# Patient Record
Sex: Male | Born: 1970 | Race: White | Hispanic: No | Marital: Married | Smoking: Never smoker
Health system: Southern US, Community
[De-identification: ages and names within clinical notes are randomized; demographics above are authoritative.]

---

## 2003-06-08 ENCOUNTER — Emergency Department (HOSPITAL_COMMUNITY): Admission: EM | Admit: 2003-06-08 | Discharge: 2003-06-08 | Payer: Self-pay | Admitting: Emergency Medicine

## 2010-11-30 ENCOUNTER — Emergency Department (HOSPITAL_COMMUNITY)
Admission: EM | Admit: 2010-11-30 | Discharge: 2010-11-30 | Payer: Self-pay | Source: Home / Self Care | Admitting: Emergency Medicine

## 2010-12-07 LAB — DIFFERENTIAL
Basophils Absolute: 0 10*3/uL (ref 0.0–0.1)
Basophils Relative: 0 % (ref 0–1)
Eosinophils Absolute: 0.2 10*3/uL (ref 0.0–0.7)
Eosinophils Relative: 2 % (ref 0–5)
Lymphocytes Relative: 34 % (ref 12–46)
Lymphs Abs: 2.1 10*3/uL (ref 0.7–4.0)
Monocytes Absolute: 0.9 10*3/uL (ref 0.1–1.0)
Monocytes Relative: 14 % — ABNORMAL HIGH (ref 3–12)
Neutro Abs: 3.2 10*3/uL (ref 1.7–7.7)
Neutrophils Relative %: 50 % (ref 43–77)

## 2010-12-07 LAB — CBC
HCT: 41.5 % (ref 39.0–52.0)
Hemoglobin: 14.5 g/dL (ref 13.0–17.0)
MCH: 30.2 pg (ref 26.0–34.0)
MCHC: 34.9 g/dL (ref 30.0–36.0)
MCV: 86.5 fL (ref 78.0–100.0)
Platelets: 211 10*3/uL (ref 150–400)
RBC: 4.8 MIL/uL (ref 4.22–5.81)
RDW: 12.4 % (ref 11.5–15.5)
WBC: 6.3 10*3/uL (ref 4.0–10.5)

## 2010-12-07 LAB — URINE CULTURE
Colony Count: NO GROWTH
Culture  Setup Time: 201201090340
Culture: NO GROWTH

## 2010-12-07 LAB — URINALYSIS, ROUTINE W REFLEX MICROSCOPIC
Bilirubin Urine: NEGATIVE
Hgb urine dipstick: NEGATIVE
Ketones, ur: NEGATIVE mg/dL
Nitrite: NEGATIVE
Protein, ur: NEGATIVE mg/dL
Specific Gravity, Urine: 1.014 (ref 1.005–1.030)
Urine Glucose, Fasting: NEGATIVE mg/dL
Urobilinogen, UA: 0.2 mg/dL (ref 0.0–1.0)
pH: 7 (ref 5.0–8.0)

## 2010-12-07 LAB — COMPREHENSIVE METABOLIC PANEL
ALT: 33 U/L (ref 0–53)
AST: 24 U/L (ref 0–37)
Albumin: 3.9 g/dL (ref 3.5–5.2)
Alkaline Phosphatase: 82 U/L (ref 39–117)
BUN: 14 mg/dL (ref 6–23)
CO2: 31 mEq/L (ref 19–32)
Calcium: 9.2 mg/dL (ref 8.4–10.5)
Chloride: 106 mEq/L (ref 96–112)
Creatinine, Ser: 1.47 mg/dL (ref 0.4–1.5)
GFR calc Af Amer: >60 mL/min (ref >60)
GFR calc non Af Amer: 54 mL/min — ABNORMAL LOW (ref >60)
Glucose, Bld: 118 mg/dL — ABNORMAL HIGH (ref 70–99)
Potassium: 3.6 mEq/L (ref 3.5–5.1)
Sodium: 142 mEq/L (ref 135–145)
Total Bilirubin: 0.4 mg/dL (ref 0.3–1.2)
Total Protein: 6.9 g/dL (ref 6.0–8.3)

## 2010-12-07 LAB — LIPASE, BLOOD: Lipase: 36 U/L (ref 11–59)

## 2014-02-22 ENCOUNTER — Encounter (HOSPITAL_COMMUNITY): Payer: Self-pay | Admitting: Emergency Medicine

## 2014-02-22 ENCOUNTER — Emergency Department (HOSPITAL_COMMUNITY): Payer: BC Managed Care – PPO

## 2014-02-22 ENCOUNTER — Emergency Department (HOSPITAL_COMMUNITY)
Admission: EM | Admit: 2014-02-22 | Discharge: 2014-02-23 | Disposition: A | Discharge status: Home / Self Care | Payer: BC Managed Care – PPO | Attending: Emergency Medicine | Admitting: Emergency Medicine

## 2014-02-22 DIAGNOSIS — Y929 Unspecified place or not applicable: Secondary | ICD-10-CM | POA: Insufficient documentation

## 2014-02-22 DIAGNOSIS — Y9351 Activity, roller skating (inline) and skateboarding: Secondary | ICD-10-CM | POA: Insufficient documentation

## 2014-02-22 DIAGNOSIS — S52121A Displaced fracture of head of right radius, initial encounter for closed fracture: Secondary | ICD-10-CM

## 2014-02-22 DIAGNOSIS — IMO0002 Reserved for concepts with insufficient information to code with codable children: Secondary | ICD-10-CM | POA: Insufficient documentation

## 2014-02-22 DIAGNOSIS — S52123A Displaced fracture of head of unspecified radius, initial encounter for closed fracture: Secondary | ICD-10-CM | POA: Insufficient documentation

## 2014-02-22 MED ORDER — OXYCODONE-ACETAMINOPHEN 5-325 MG PO TABS
1.0000 | ORAL_TABLET | Freq: Once | ORAL | Status: AC
  Administered 2014-02-23: 1 via ORAL
  Filled 2014-02-22: qty 1

## 2014-02-22 NOTE — ED Notes (Signed)
[facility]

## 2014-02-22 NOTE — ED Provider Notes (Signed)
CSN: [id number]     Arrival date & time 02/22/14  2218 History  This chart was scribed for Hayden Galesavid Masneri, MD by Ardelia Memsylan Malpass, ED Scribe. This patient was seen in room TR10C/TR10C and the patient's care was started at 11:31 PM.   Chief Complaint  Patient presents with  . Arm Injury    The history is provided by the patient. No language interpreter was used.   HPI Comments: Hayden Lopez is a 43 y.o. male who presents to the Emergency Department complaining of a fall off of a skateboard that occurred about 2 hours ago. He states that he landed on concrete hitting his right elbow and shoulder, where he has been having pain since. He denies hitting his head or LOC. He denies any prior history of injury to the arm. He also states that he sustained an abrasion to his left forearm upon falling. He reports that his Tetanus vaccinations are UTD. He states that he has no medication allergies.   History reviewed. No pertinent past medical history. History reviewed. No pertinent past surgical history. No family history on file. History  Substance Use Topics  . Smoking status: Never Smoker   . Smokeless tobacco: Not on file  . Alcohol Use: Yes    Review of Systems  Musculoskeletal: Positive for arthralgias.  Skin: Positive for wound.  Neurological: Negative for syncope and headaches.  All other systems reviewed and are negative.   Allergies  Review of patient's allergies indicates no known allergies.  Home Medications  No current outpatient prescriptions on file.  Triage Vitals: BP 94/73  Pulse 86  Temp(Src) 98.3 F (36.8 C) (Oral)  Resp 18  Ht 5\' 10"  (1.778 m)  Wt 190 lb (86.183 kg)  BMI 27.26 kg/m2  SpO2 99%  Physical Exam  Nursing note and vitals reviewed. Constitutional: He is oriented to person, place, and time. He appears well-developed and well-nourished. No distress.  HENT:  Head: Normocephalic and atraumatic.  Eyes: EOM are normal.  Neck: Neck supple. No tracheal  deviation present.  Cardiovascular: Normal rate.   Pulmonary/Chest: Effort normal. No respiratory distress.  Musculoskeletal: He exhibits edema and tenderness.  Right arm: Right hand with normal grip. Wrist with normal flexion and extension. Decreased supination and pronation secondary to pain. Normal radial pulse. Elbow tenderness to the dorsal aspect of the olecranon with mild edema noted. Decreased elbow flexion and extension secondary to pain. Left forearm with abrasion noted to the laterodorsal aspect of the forearm without any significant deformity noted. Full ROM.  Neurological: He is alert and oriented to person, place, and time.  Skin: Skin is warm and dry.  Psychiatric: He has a normal mood and affect. His behavior is normal.    ED Course  Procedures (including critical care time)  DIAGNOSTIC STUDIES: Oxygen Saturation is 99% on RA, normal by my interpretation.    COORDINATION OF CARE: 11:37 PM- Discussed plan to obtain radiology of pt's right arm. Will also give pain medication. Pt advised of plan for treatment and pt agrees.  12:32 AM Xray of R elbow demonstrate a minimally comminuted fx involving the radial aspect of the radial head, with mild cortical irregularity along the articular surface.  Associated elbow joint effusion seen.  Pt is NVI.  i discussed this finding with pt and with Dr. Romeo AppleHarrison and Dr. Redgie GrayerMasneri, who recommend long arm splint, sling, and ortho f/u on Monday.  Pt prefers to be seen by Dr. Yisroel Rammingaldorf, will give referral.  Return precaution discussed.  Medications  oxyCODONE-acetaminophen (PERCOCET/ROXICET) 5-325 MG per tablet 1 tablet (not administered)   Labs Review Labs Reviewed - No data to display Imaging Review Dg Humerus Right  02/22/2014   CLINICAL DATA:  Status post fall; skateboarding injury. Pain along the distal right arm.  EXAM: RIGHT HUMERUS - 2+ VIEW  COMPARISON:  None.  FINDINGS: There is no evidence of fracture or dislocation. The right  humerus appears intact. Visualized joint spaces are preserved. The right humeral head remains seated at the glenoid fossa.  The right acromioclavicular joint is unremarkable in appearance. No significant soft tissue abnormalities are characterized on radiograph.  IMPRESSION: No evidence of fracture or dislocation.   Electronically Signed   By: Roanna Raider M.D.   On: 02/22/2014 23:43     EKG Interpretation None      MDM   Final diagnoses:  Fracture of radial head, right, closed    BP 94/73  Pulse 86  Temp(Src) 98.3 F (36.8 C) (Oral)  Resp 18  Ht 5\' 10"  (1.778 m)  Wt 190 lb (86.183 kg)  BMI 27.26 kg/m2  SpO2 99%  I have reviewed nursing notes and vital signs. I personally reviewed the imaging tests through PACS system  I reviewed available ER/hospitalization records thought the EMR  I personally performed the services described in this documentation, which was scribed in my presence. The recorded information has been reviewed and is accurate.    Fayrene Helper, PA-C 02/23/14 [phone]

## 2014-02-22 NOTE — ED Notes (Signed)
The pt was riding a skateboard tonight and he struck a curb and fell of the skateboard.  Painful rt  Upper arm pain and elbow..  Lt elbow has an abrasion and he struck his head no loc

## 2014-02-22 NOTE — ED Notes (Signed)
Pt alert, NAD, calm, interactive. EDPA  into room.

## 2014-02-23 MED ORDER — OXYCODONE-ACETAMINOPHEN 5-325 MG PO TABS: 2.0000 | ORAL_TABLET | ORAL | Status: DC | PRN

## 2014-02-23 NOTE — Progress Notes (Signed)
Orthopedic Tech Progress Note Patient Details:  Hayden HuhJoseph Lopez 10/19/1971 [id number]  Ortho Devices Type of Ortho Device: Arm sling;Post (long arm) splint   Haskell Flirtewsome, Rajah Lamba M 02/23/2014, 12:35 AM

## 2014-02-23 NOTE — ED Notes (Signed)
Pt fell skateboarding, c/o R elbow pain, radiates up (mid humerus) and down (mid ulnar/radius), CMS intact, mentions some mild numbness and tingling. Guarding R elbow movements. Abrasion noted to L FA. (denies: LOC, head, neck or back pain, denies other pains or injuries, nv, dizziness or other sx. No meds PTA. Ice applied to R elbow.

## 2014-02-23 NOTE — Discharge Instructions (Signed)
Please call and follow up with Dr. Jerl Santos on Monday for close evaluation of your elbow injury.  Follow instruction below.  Return if you have any concerns.   Elbow Fracture, Radial Head with Rehab The radial head is the end of the forearm bone on the thumb side of the arm (radius). It is part of the elbow. The radial head is vulnerable to both complete and incomplete fractures. SYMPTOMS   Severe elbow and arm pain, at the time of injury.  Tenderness, inflammation, and later bruising (contusion) of the elbow (within 48 hours).  Visible deformity, if the fracture is complete, and the bone fragments are not aligned properly (displaced).  Numbness, coldness, or paralysis in the elbow, forearm, or hand from pressure on the blood vessels or nerves (uncommon). CAUSES  An elbow fracture occurs when a force is placed on the bone that is greater than it can handle. Typical causes of injury include:  Indirect trauma, such as falling on an outstretched hand (most common cause).  Direct hit (trauma) to the elbow.  Twisting injury to the elbow. RISK INCREASES WITH:  Contact sports (football, rugby).  Sports in which falling is likely (skating, basketball).  Children under 86 years of age and adults over 73.  Bone or joint disease (osteoarthritis, bone tumor).  Poor strength and flexibility. PREVENTION   Warm up and stretch properly before activity.  Maintain physical fitness:  Strength, flexibility, and endurance.  Cardiovascular fitness.  When appropriate, wear properly fitted and padded elbow protection. PROGNOSIS  If treated properly, elbow fractures often heal within 6 to 8 weeks for adults, and 4 to 6 weeks in children.  RELATED COMPLICATIONS   Fracture does not heal (nonunion).  Fracture heals in improper alignment (malunion).  Chronic pain, stiffness, loss of motion, or swelling of the elbow.  Excessive bleeding in the elbow or at the fracture site, causing pressure  and injury to nerves and blood vessels (uncommon).  Calcification of the soft tissues around the elbow (heterotopic ossification).  Risk of bone death, due to interrupted blood supply caused by the fracture.  Unstable or arthritic joint, following repeated injury.  Stopping of normal bone growth in children.  Wasting away (atrophy), weakness, stiffness, numbness, and poor control of the hand, due to injury to blood vessels, nerves, cartilage, muscle, ligaments, and connective tissue sheets (fascia). TREATMENT  If the fracture is displaced, it must be put back in proper alignment (reduced) by an individual trained in the procedure. Often, displaced fractures cannot be realigned by hand, and surgery is needed. Once the bones are aligned (with or without surgery), ice and medicine can be used to reduce pain and inflammation. The elbow should be restrained for at least 1 to 2 weeks. After restraint, it is important to complete strengthening and stretching exercises, to regain strength and a full range of motion. Theses exercises may be completed at home or with a therapist.  MEDICATION   If pain medicine is needed, nonsteroidal anti-inflammatory medicines (aspirin and ibuprofen), or other minor pain relievers (acetaminophen), are often advised.  Do not take pain medicine for 7 days before surgery.  Prescription pain relievers may be given, if your caregiver thinks they are needed. Use only as directed and only as much as you need. COLD THERAPY  Cold treatment (icing) should be applied for 10 to 15 minutes every 2 to 3 hours for inflammation and pain, and immediately after activity that aggravates your symptoms. Use ice packs or an ice massage. SEEK MEDICAL  CARE IF:  Pain, tenderness, or swelling gets worse, despite treatment.  You experience pain, numbness, or coldness in the hand.  Blue, gray, or dark color appears in the fingernails.  Any of the following occur after surgery: fever,  increased pain, swelling, redness, drainage of fluids, or bleeding in the affected area.  New, unexplained symptoms develop. (Drugs used in treatment may produce side effects.) EXERCISES  RANGE OF MOTION (ROM) AND STRETCHING EXERCISES - Elbow Fracture (Radial Head) These exercises may help you restore your elbow mobility once your physician has discontinued your restraint period. Beginning these before your caregiver's approval may result in delayed healing. Your symptoms may go away with or without further involvement from your physician, physical therapist or athletic trainer. While completing these exercises, remember:   Restoring tissue flexibility helps normal motion to return to the joints. This allows healthier, less painful movement and activity.  An effective stretch should be held for at least 30 seconds. A stretch should never be painful. You should only feel a gentle lengthening or release in the stretch. RANGE OF MOTION  Supination, Active-Assisted  Sit with your right / left elbow bent at 90 degrees, resting your forearm on a table.  Keeping your upper body and shoulder in place, roll your forearm so your palm faces upward. When you can go no farther, use your opposite hand to help, until you feel a gentle to moderate stretch. Hold for __________ seconds.  Slowly release the stretch and return to the starting position. Repeat __________ times. Complete this exercise __________ times per day. RANGE OF MOTION  Pronation, Active-Assisted   Sit with your right / left elbow bent at 90 degrees, resting your forearm on a table.  Keeping your upper body and shoulder in place, roll your forearm so your palm faces the tabletop. When you can go no farther, use your opposite hand to help, until you feel a gentle to moderate stretch. Hold for __________ seconds.  Slowly release the stretch and return to the starting position. Repeat __________ times. Complete this exercise __________ times  per day. RANGE OF MOTION - Extension  Hold your right / left arm at your side and straighten your elbow as far as you can, using your right / left arm muscles.  Straighten the right / left elbow farther by gently pushing down on your forearm, until you feel a gentle stretch on the inside of your elbow. Hold this position for __________ seconds.  Slowly return to the starting position. Repeat __________ times. Complete this exercise __________ times per day.  RANGE OF MOTION  Flexion  Hold your right / left arm at your side and bend your elbow as far as you can, using your right / left arm muscles.  Bend the right / left elbow farther by gently pushing up on your forearm, until you feel a gentle stretch on the outside of your elbow. Hold this position for __________ seconds.  Slowly return to the starting position. Repeat __________ times. Complete this exercise __________ times per day.  RANGE OF MOTION  Supination, Active  Stand or sit with your elbows at your side. Bend your right / left elbow to 90 degrees.  Turn your palm upward until you feel a gentle stretch on the inside of your forearm.  Hold this position for __________ seconds. Slowly release and return to the starting position. Repeat __________ times. Complete this stretch __________ times per day.  RANGE OF MOTION  Pronation, Active  Stand or sit  with your elbows at your side. Bend your right / left elbow to 90 degrees.  Turn your palm downward until you feel a gentle stretch on the top of your forearm.  Hold this position for __________ seconds. Slowly release and return to the starting position. Repeat __________ times. Complete this stretch __________ times per day.  RANGE OF MOTION  Elbow Flexion, Supine  Lie on your back. Extend your right / left arm into the air, bracing it with your opposite hand. Allow your right / left arm to relax.  Let your elbow bend, allowing your hand to fall slowly toward your  chest.  You should feel a gentle stretch along the back of your upper arm and elbow. Your physician, physical therapist or athletic trainer may ask you to hold a __________ hand weight, to increase the intensity of this stretch.  Hold for __________ seconds. Slowly return your right / left arm to the upright position. Repeat __________ times. Complete this exercise __________ times per day. STRETCH  Elbow flexors   Lie on a firm bed or countertop, on your back. Be sure that you are in a comfortable position which will allow you to relax your arm muscles.  Place a folded towel under your right / left upper arm, so that your elbow and shoulder are at the same height. Extend your arm; your elbow should not rest on the bed or towel.  Allow the weight of your hand to straighten your elbow. Keep your arm and chest muscles relaxed. Your caregiver may ask you to increase the intensity of your stretch by adding a small wrist or hand weight.  Hold for __________ seconds. You should feel a stretch on the inside of your elbow. Slowly return to the starting position. Repeat __________ times. Complete this exercise __________ times per day. STRENGTHENING EXERCISES - Elbow Fracture (Radial Head) These exercises may help you restore your elbow mobility once your physician has discontinued your restraint period. Beginning these before your caregiver's approval may result in delayed healing. Your symptoms may go away with or without further involvement from your physician, physical therapist or athletic trainer. While completing these exercises, remember:   Restoring tissue flexibility helps normal motion to return to the joints. This allows healthier, less painful movement and activity.  An effective stretch should be held for at least 30 seconds. A stretch should never be painful. You should only feel a gentle lengthening or release in the stretch.  You may experience muscle soreness or fatigue, but the pain  or discomfort you are trying to eliminate should never worsen during these exercises. If this pain does get worse, stop and make sure you are following the directions exactly. If the pain is still present after adjustments, discontinue the exercise until you can discuss the trouble with your caregiver. STRENGTH - Elbow Flexors, Isometric  Stand or sit upright, on a firm surface. Place your right / left arm so that your hand is palm-up, and at the height of your waist.  Place your opposite hand on top of your forearm. Gently push down as your right / left arm resists. Push as hard as you can with both arms, without causing any pain or movement at your right / left elbow. Hold this stationary position for __________ seconds.  Gradually release the tension in both arms. Allow your muscles to relax completely before repeating. Repeat __________ times. Complete this exercise __________ times per day. STRENGTH - Elbow Extensors, Isometric  Stand or sit upright,  on a firm surface. Place your right / left arm so that your palm faces your stomach, and it is at the height of your waist.  Place your opposite hand on the underside of your forearm. Gently push up as your right / left arm resists. Push as hard as you can with both arms, without causing any pain or movement at your right / left elbow. Hold this stationary position for __________ seconds.  Gradually release the tension in both arms. Allow your muscles to relax completely before repeating. Repeat __________ times. Complete this exercise __________ times per day. STRENGTH  Elbow Flexors, Supinated  With good posture, stand, or sit on a firm chair without armrests. Allow your right / left arm to rest at your side with your palm facing forward.  Holding a __________ weight, or gripping a rubber exercise band or tubing, bring your hand toward your shoulder.  Allow your muscles to control the resistance, as your hand returns to your side. Repeat  __________ times. Complete this exercise __________ times per day.  STRENGTH - Elbow Extensors, Dynamic  With good posture, stand, or sit on a firm chair without armrests. Keeping your upper arms at your side, bring both hands up toward your right / left shoulder, while gripping a rubber exercise band or tubing. Your right / left hand should be just below the other hand.  Straighten your right / left elbow. Hold for __________ seconds.  Allow your muscles to control the rubber exercise band, as your hand returns to your shoulder. Repeat __________ times. Complete this exercise __________ times per day. STRENGTH  Forearm Supinators   Sit with your right / left forearm supported on a table, keeping your elbow below shoulder height. Rest your hand over the edge, palm down.  Gently grip a hammer or a soup ladle.  Without moving your elbow, slowly turn your palm and hand upward to a "thumbs-up" position.  Hold this position for __________ seconds. Slowly return to the starting position. Repeat __________ times. Complete this exercise __________ times per day.  STRENGTH  Forearm Pronators  Sit with your right / left forearm supported on a table, keeping your elbow below shoulder height. Rest your hand over the edge, palm up.  Gently grip a hammer or a soup ladle.  Without moving your elbow, slowly turn your palm and hand upward to a "thumbs-up" position.  Hold this position for __________ seconds. Slowly return to the starting position. Repeat __________ times. Complete this exercise __________ times per day.  Document Released: 11/08/2005 Document Revised: 01/31/2012 Document Reviewed: 02/20/2009 [facility] Patient Information 2014 [place], [place].

## 2014-02-24 NOTE — ED Provider Notes (Signed)
Medical screening examination/treatment/procedure(s) were performed by non-physician practitioner and as supervising physician I was immediately available for consultation/collaboration.   EKG Interpretation None        Junius ArgyleForrest S Prabhleen Montemayor, MD 02/24/14 1159

## 2014-11-22 HISTORY — PX: ELBOW ARTHROSCOPY W/ ARTHROTOMY AND DEBRIDEMENT: SHX1488

## 2015-03-27 IMAGING — CR DG ELBOW COMPLETE 3+V*R*
4 series · 4 of 4 positions shown · non-contrast
Comparison: None.

CLINICAL DATA: Skateboarding injury; status post fall on
outstretched arm. Right elbow pain.

EXAM:
RIGHT ELBOW - COMPLETE 3+ VIEW

[x elbow lat right]
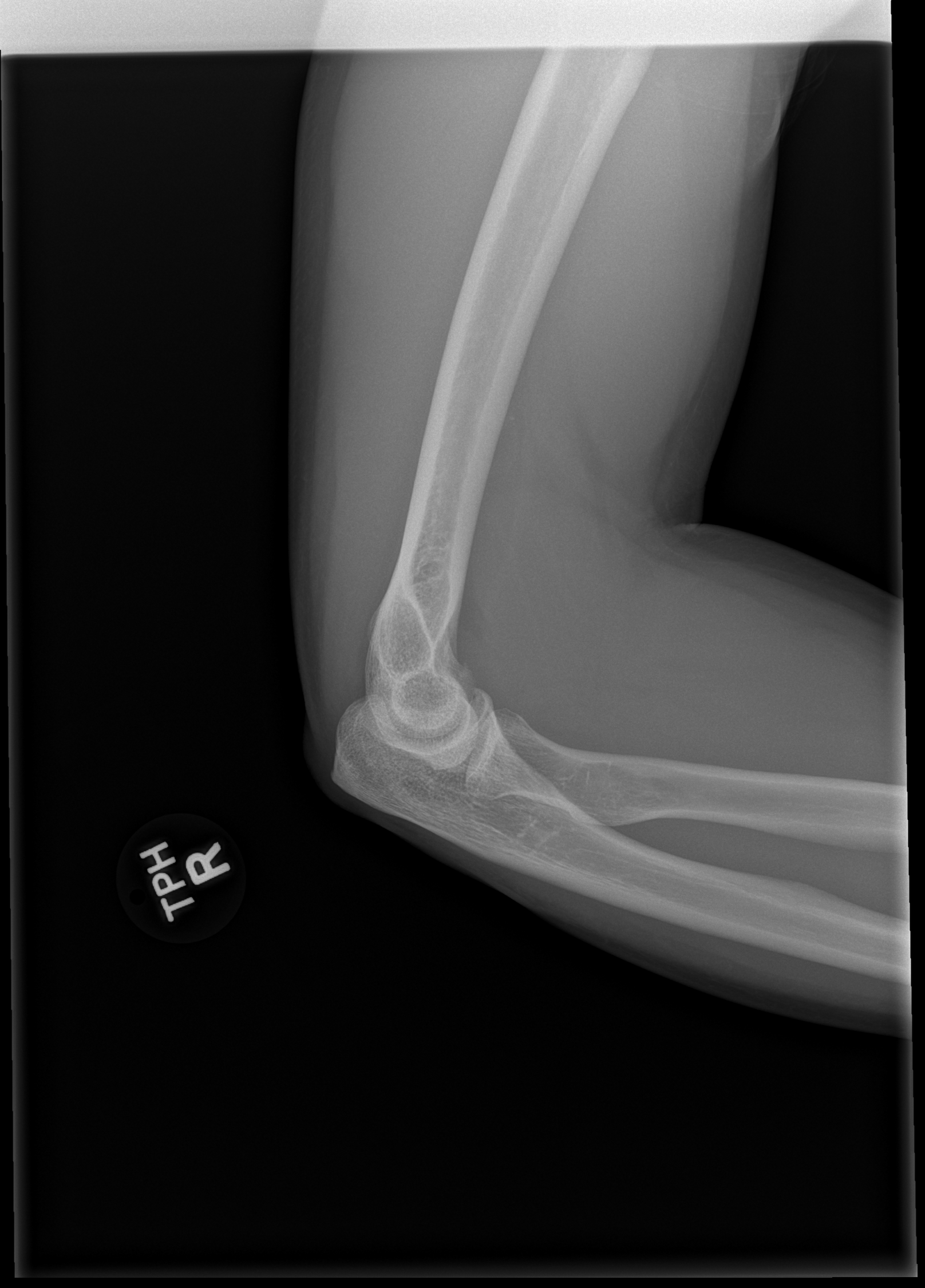

[x elbow obl right (1 of 2)]
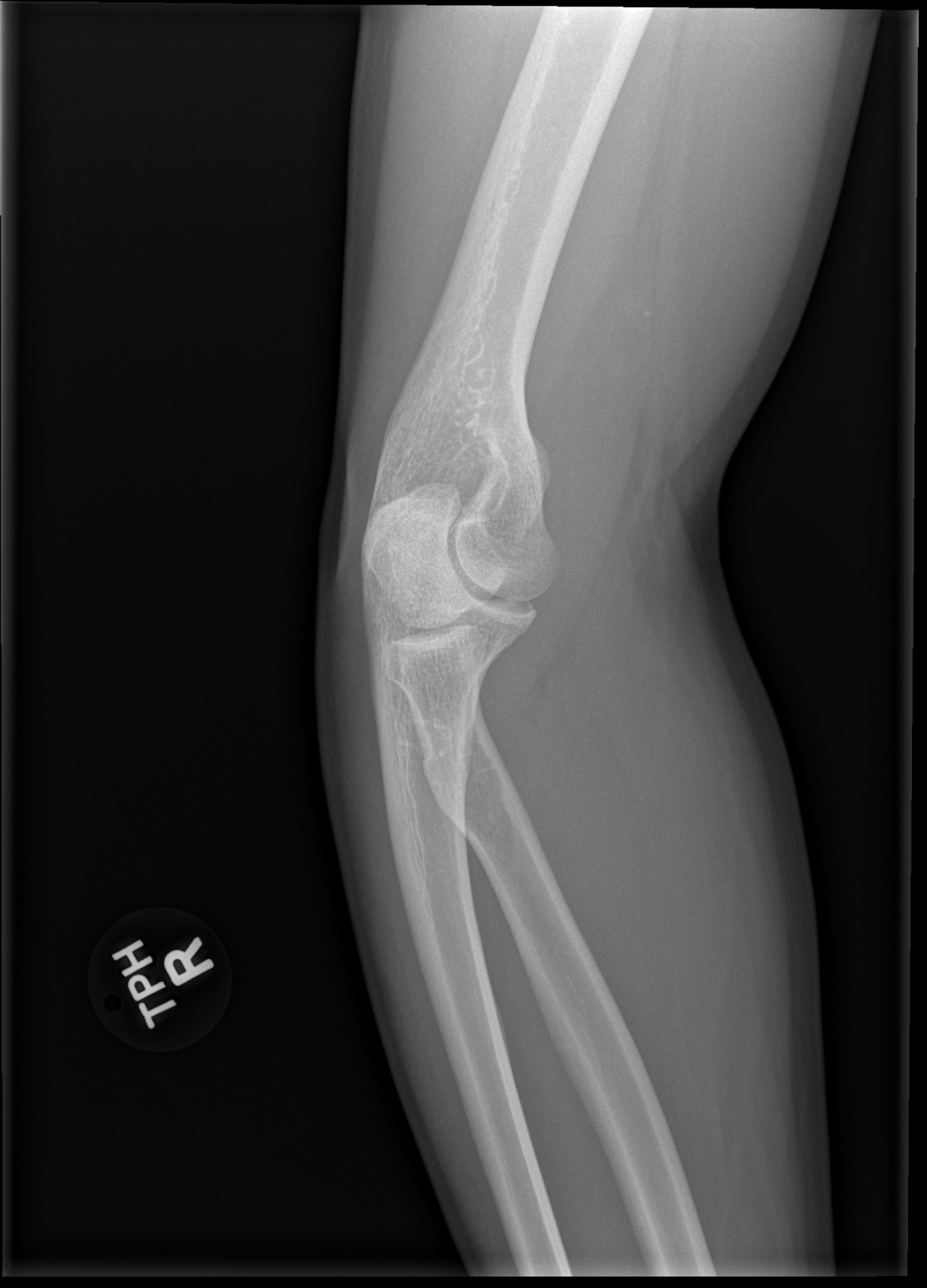

[x elbow ap right]
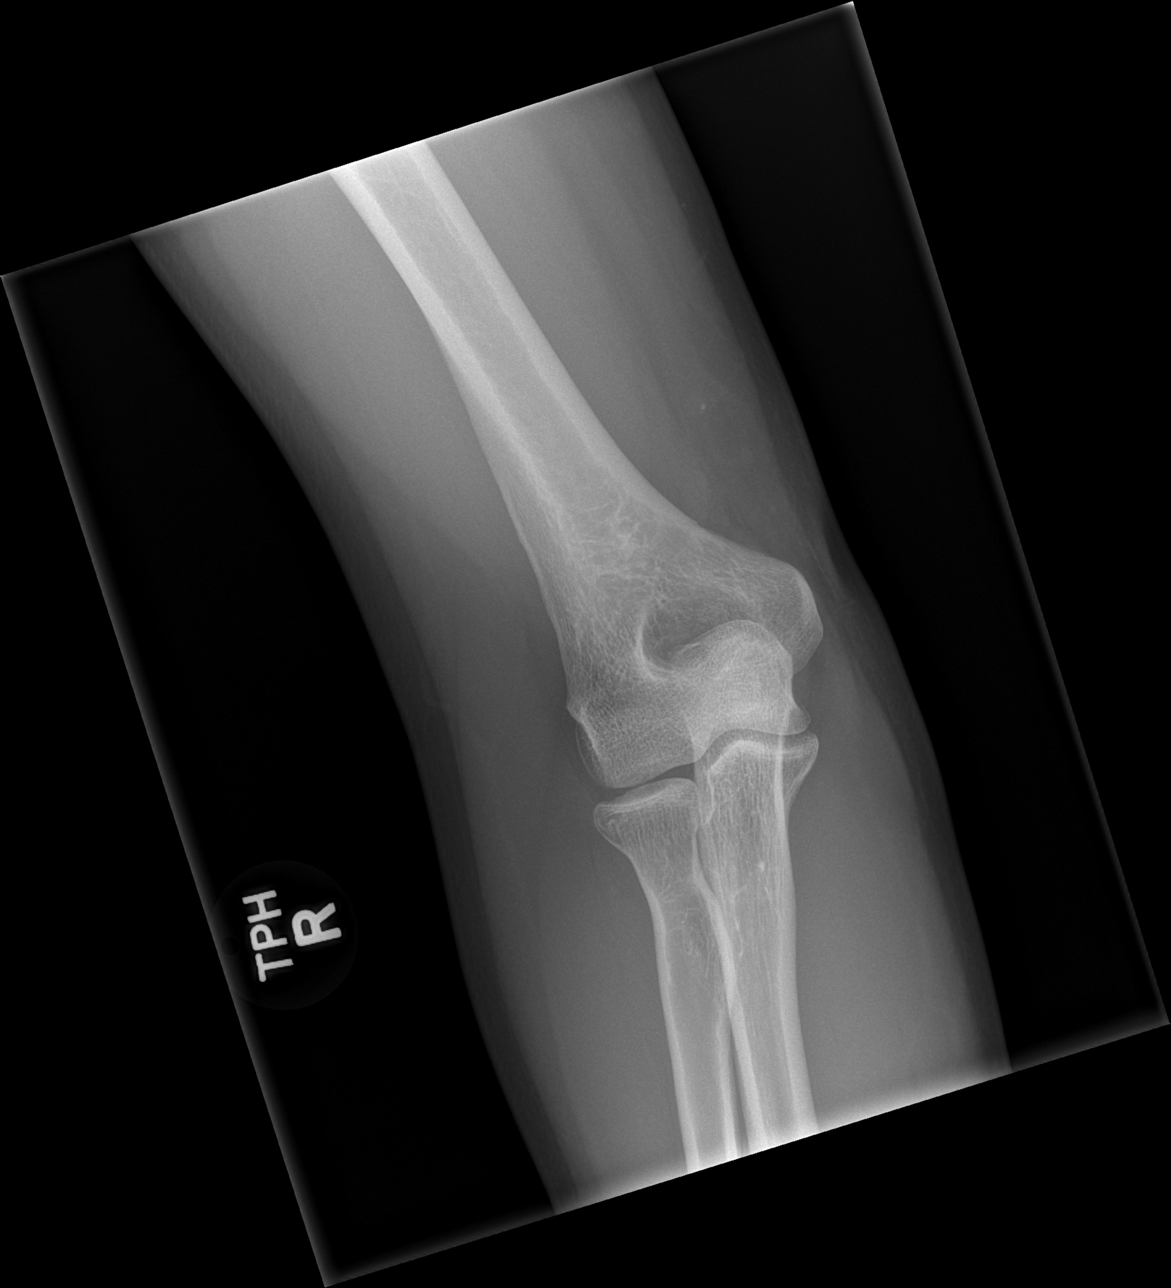

[x elbow obl right (2 of 2)]
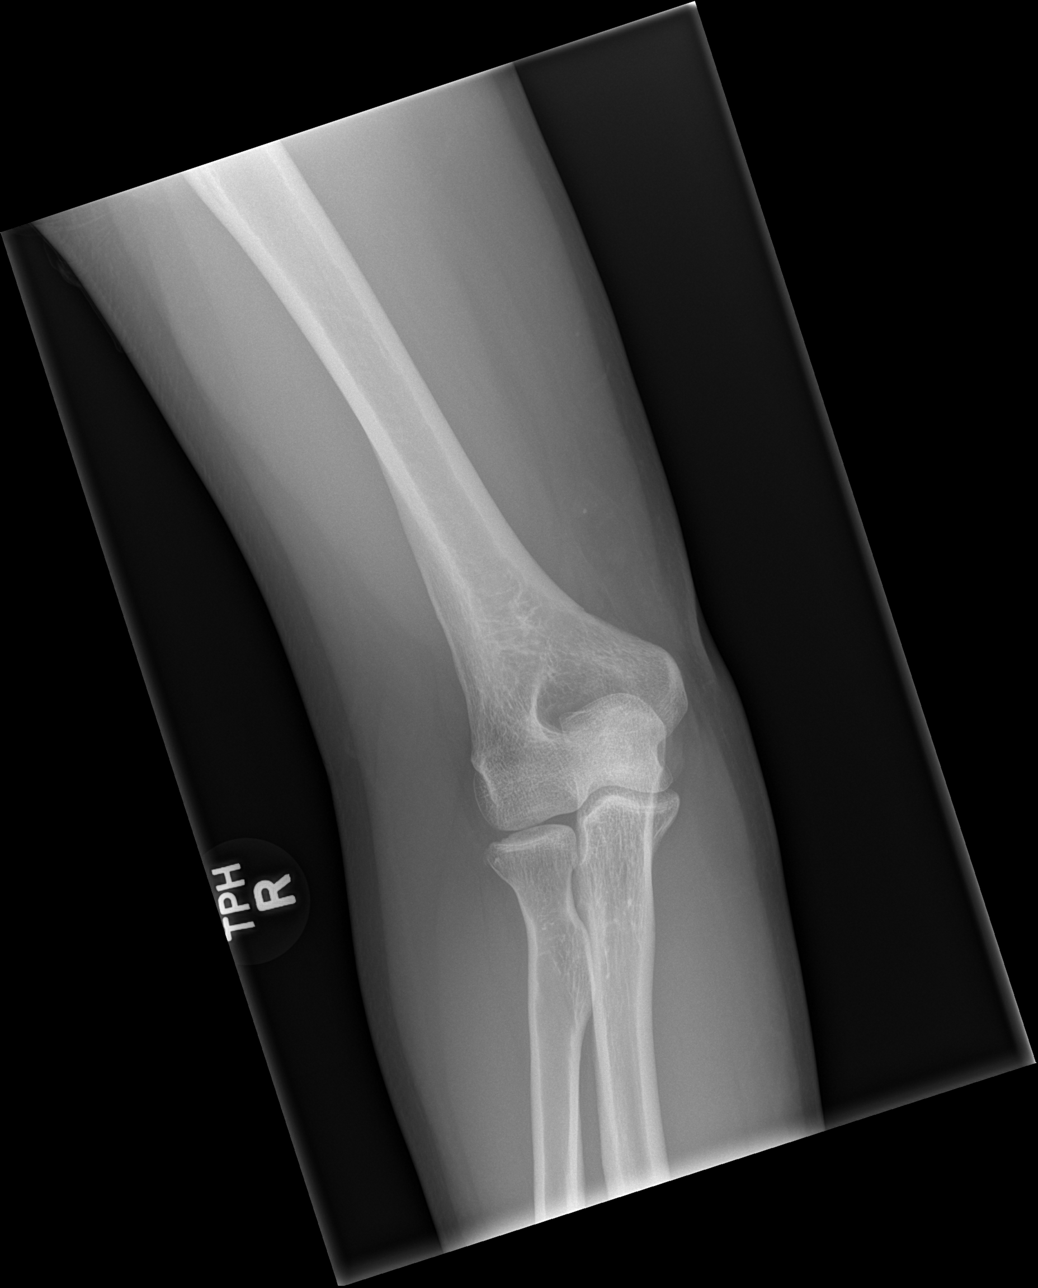

[4 of 4 positions shown; findings below may reference images not displayed]

FINDINGS: There appears to be a minimally comminuted fracture involving the
radial aspect of the radial head, with mild cortical irregularity
along the articular surface. An associated elbow joint effusion is
noted. No additional fractures are identified. No additional soft
tissue abnormalities are identified on radiograph.
IMPRESSION: Minimally comminuted fracture involving the radial aspect of the
radial head, with mild cortical irregularity along the articular
surface. Associated elbow joint effusion seen.

## 2015-03-27 IMAGING — CR DG HUMERUS 2V *R*
2 series · 2 of 2 positions shown · non-contrast
Comparison: None.

CLINICAL DATA: Status post fall; skateboarding injury. Pain along
the distal right arm.

EXAM:
RIGHT HUMERUS - 2+ VIEW

[w humerus ap right]
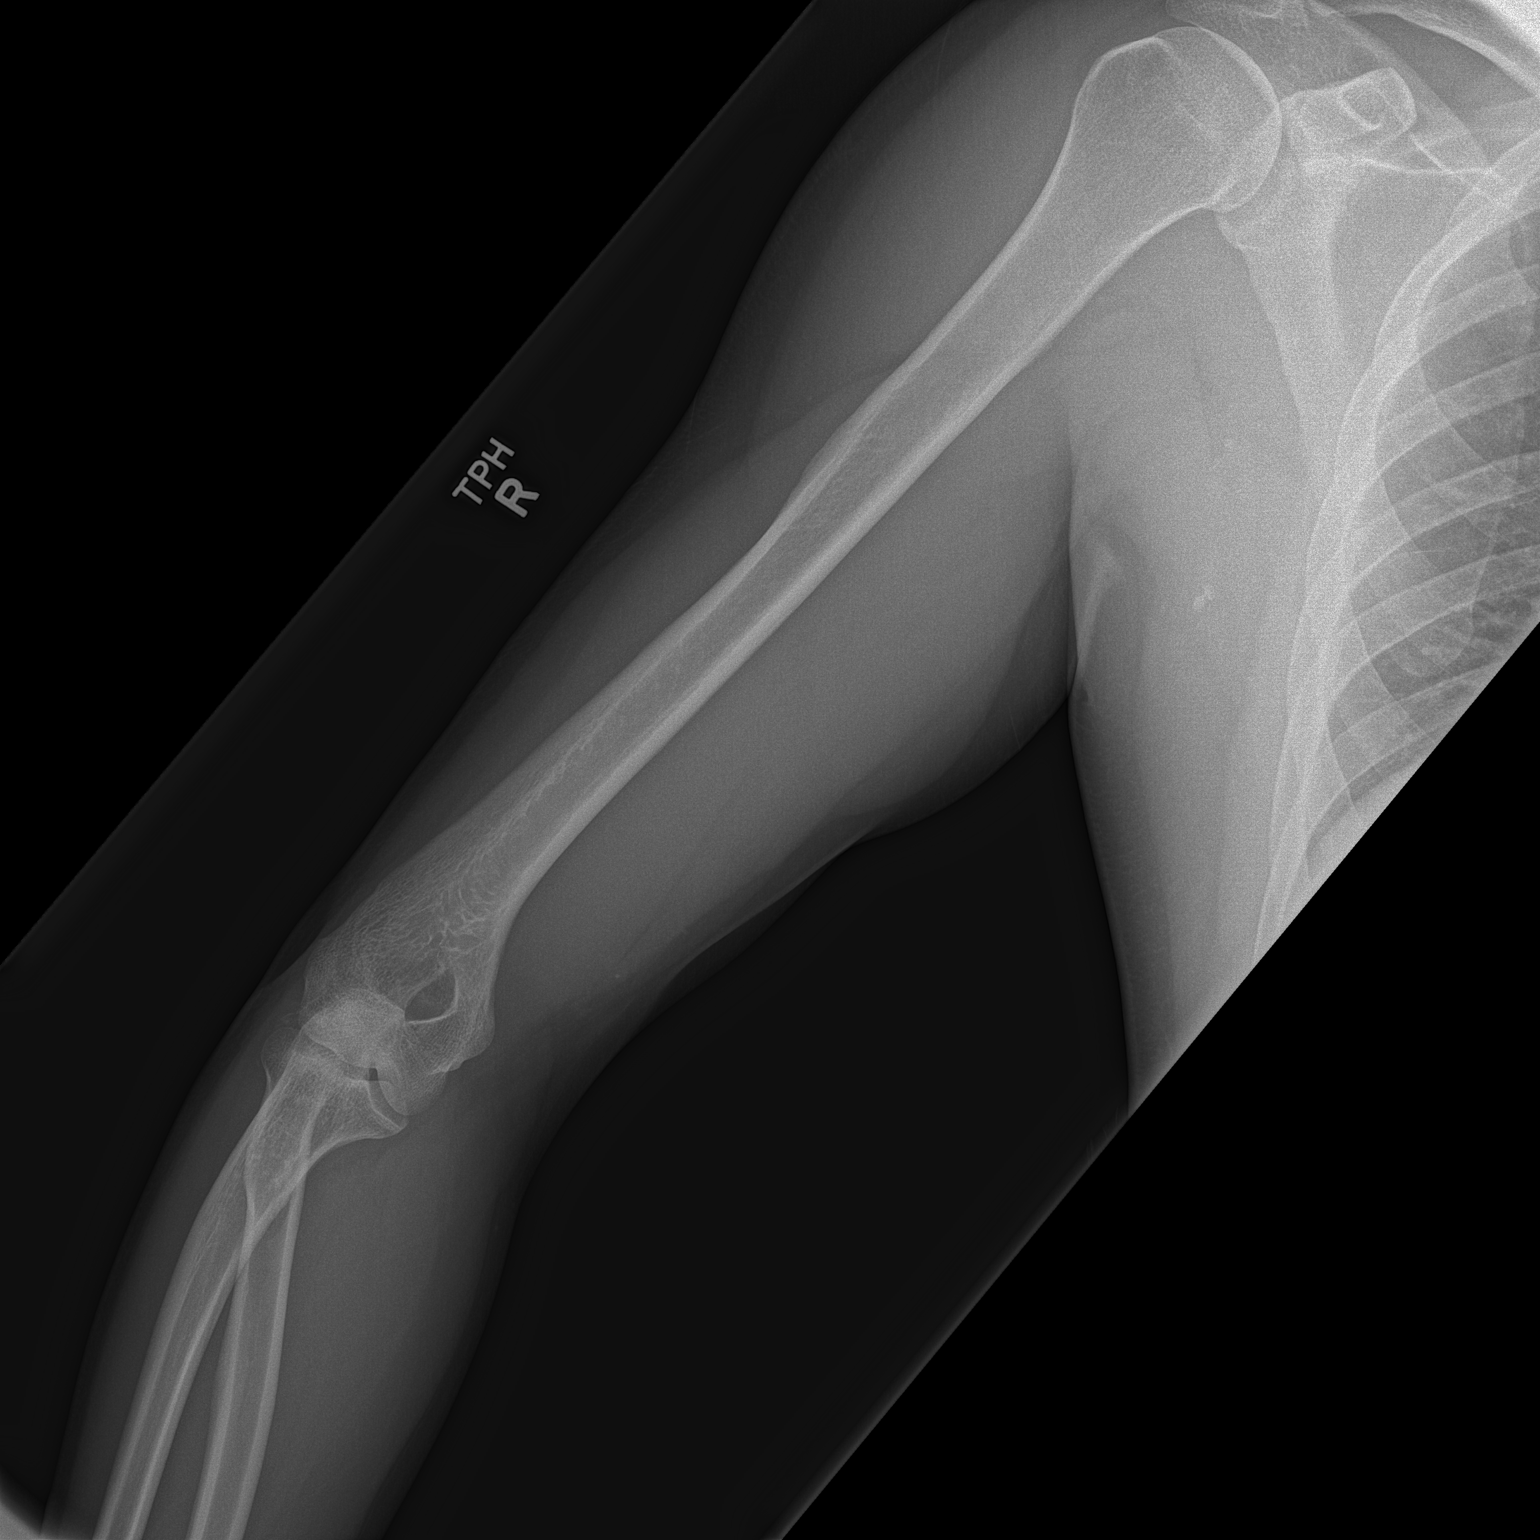

[w humerus lat right]
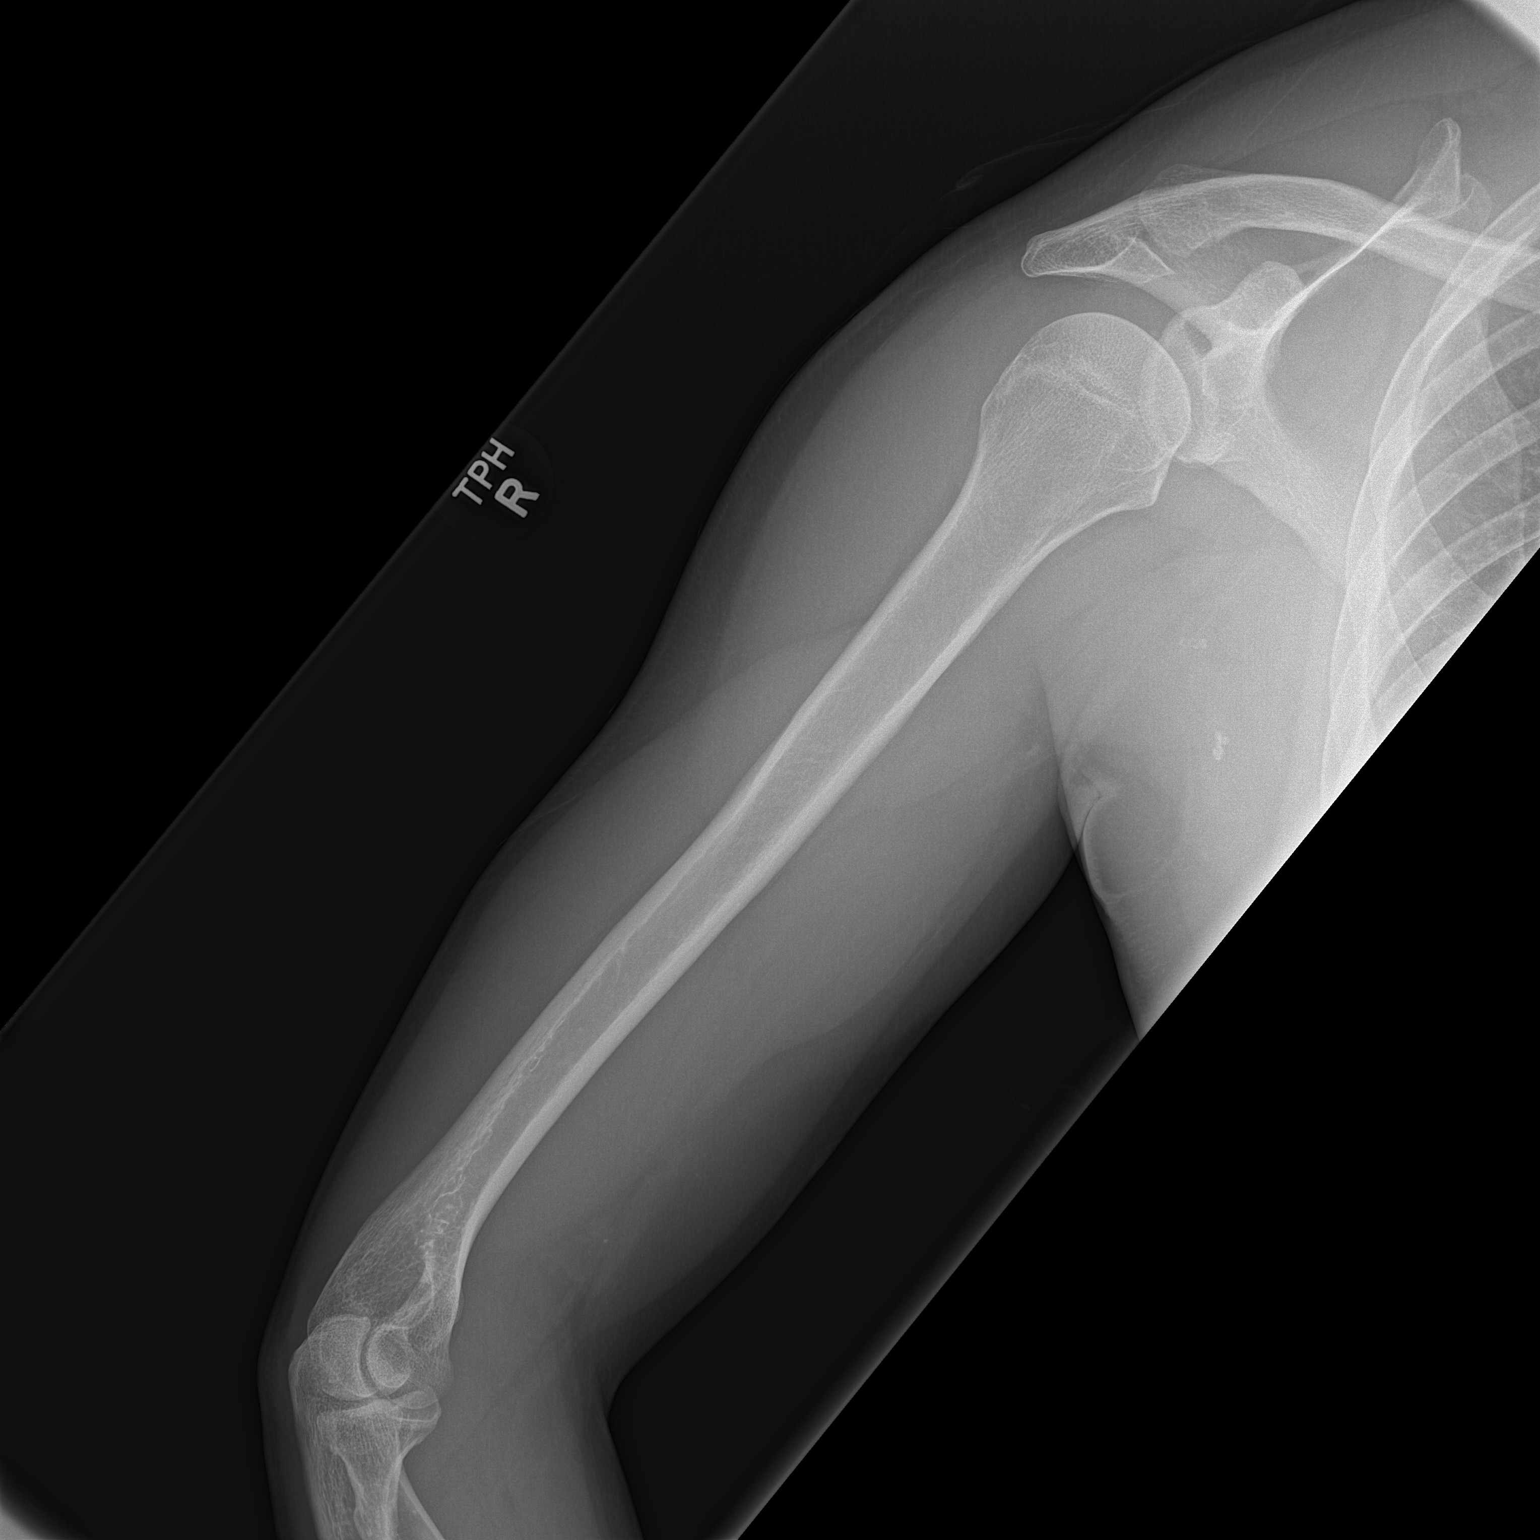

[2 of 2 positions shown; findings below may reference images not displayed]

FINDINGS: There is no evidence of fracture or dislocation. The right humerus
appears intact. Visualized joint spaces are preserved. The right
humeral head remains seated at the glenoid fossa.

The right acromioclavicular joint is unremarkable in appearance. No
significant soft tissue abnormalities are characterized on
radiograph.
IMPRESSION: No evidence of fracture or dislocation.

## 2015-04-30 ENCOUNTER — Other Ambulatory Visit (HOSPITAL_COMMUNITY)
Admission: RE | Admit: 2015-04-30 | Discharge: 2015-04-30 | Disposition: A | Discharge status: Home / Self Care | Payer: BLUE CROSS/BLUE SHIELD | Source: Ambulatory Visit | Attending: Chiropractic Medicine | Admitting: Chiropractic Medicine

## 2015-04-30 DIAGNOSIS — Z01818 Encounter for other preprocedural examination: Secondary | ICD-10-CM | POA: Insufficient documentation

## 2015-04-30 LAB — HEMOGLOBIN AND HEMATOCRIT, BLOOD
HCT: 44.1 % (ref 39.0–52.0)
Hemoglobin: 15.4 g/dL (ref 13.0–17.0)

## 2015-11-23 HISTORY — PX: ELBOW ARTHROSCOPY W/ DRILLING OSTEOCHONDRITIS LESION: SHX1489

## 2022-12-13 ENCOUNTER — Other Ambulatory Visit: Payer: Self-pay | Admitting: Internal Medicine

## 2022-12-13 ENCOUNTER — Ambulatory Visit
Admission: RE | Admit: 2022-12-13 | Discharge: 2022-12-13 | Disposition: A | Discharge status: Home / Self Care | Payer: BLUE CROSS/BLUE SHIELD | Source: Ambulatory Visit | Attending: Internal Medicine | Admitting: Internal Medicine

## 2022-12-13 DIAGNOSIS — M542 Cervicalgia: Secondary | ICD-10-CM

## 2023-10-18 ENCOUNTER — Ambulatory Visit (AMBULATORY_SURGERY_CENTER): Payer: Managed Care, Other (non HMO)

## 2023-10-18 VITALS — Ht 69.5 in | Wt 198.0 lb

## 2023-10-18 DIAGNOSIS — Z1211 Encounter for screening for malignant neoplasm of colon: Secondary | ICD-10-CM

## 2023-10-18 MED ORDER — NA SULFATE-K SULFATE-MG SULF 17.5-3.13-1.6 GM/177ML PO SOLN: 1.0000 | Freq: Once | ORAL | 0 refills | Status: AC

## 2023-10-18 NOTE — Progress Notes (Signed)
No egg or soy allergy known to patient  No issues known to pt with past sedation with any surgeries or procedures Patient denies ever being told they had issues or difficulty with intubation  No FH of Malignant Hyperthermia Pt is not on diet pills Pt is not on  home 02  Pt is not on blood thinners  Pt denies issues with constipation  No A fib or A flutter Have any cardiac testing pending--no Patient's chart reviewed by Cathlyn Parsons CNRA prior to previsit and patient appropriate for the [facility].  Previsit completed and red dot placed by patient's name on their procedure day (on provider's schedule).   Patient's chart reviewed by Cathlyn Parsons CNRA prior to previsit and patient appropriate for the [facility].  Previsit completed and red dot placed by patient's name on their procedure day (on provider's schedule).    Pt instructed to use Singlecare.com or GoodRx for a price reduction on prep  Ambulates independently

## 2023-10-26 ENCOUNTER — Encounter: Payer: Self-pay | Admitting: Gastroenterology

## 2023-11-07 ENCOUNTER — Telehealth: Payer: Self-pay | Admitting: Gastroenterology

## 2023-11-07 NOTE — Telephone Encounter (Signed)
Patient called stating he has a slight cough. Please advise.

## 2023-11-07 NOTE — Telephone Encounter (Signed)
Returned pts call.  He is concerned about having a cough. He was sick last week but has been fever free since Friday. Only has a slight cough that he is taking Guaifenesin for.  Wants to know if its ok to proceed with procedure.  Pt has not other symptoms. Advised him that it is ok to proceed and he can continue to take Guaifenesin.

## 2023-11-08 ENCOUNTER — Ambulatory Visit: Payer: Managed Care, Other (non HMO) | Admitting: Gastroenterology

## 2023-11-08 ENCOUNTER — Encounter: Payer: Self-pay | Admitting: Gastroenterology

## 2023-11-08 VITALS — BP 102/62 | HR 74 | Temp 97.8°F | Resp 19 | Ht 69.0 in | Wt 198.0 lb

## 2023-11-08 DIAGNOSIS — Z1211 Encounter for screening for malignant neoplasm of colon: Secondary | ICD-10-CM

## 2023-11-08 DIAGNOSIS — K644 Residual hemorrhoidal skin tags: Secondary | ICD-10-CM | POA: Diagnosis not present

## 2023-11-08 DIAGNOSIS — D120 Benign neoplasm of cecum: Secondary | ICD-10-CM

## 2023-11-08 DIAGNOSIS — D122 Benign neoplasm of ascending colon: Secondary | ICD-10-CM | POA: Diagnosis not present

## 2023-11-08 DIAGNOSIS — K648 Other hemorrhoids: Secondary | ICD-10-CM | POA: Diagnosis not present

## 2023-11-08 DIAGNOSIS — D123 Benign neoplasm of transverse colon: Secondary | ICD-10-CM | POA: Diagnosis not present

## 2023-11-08 MED ORDER — SODIUM CHLORIDE 0.9 % IV SOLN: 500.0000 mL | INTRAVENOUS | Status: DC

## 2023-11-08 NOTE — Progress Notes (Signed)
Pt's states no medical or surgical changes since previsit or office visit. 

## 2023-11-08 NOTE — Progress Notes (Unsigned)
Report to PACU, RN, vss, BBS= Clear.  

## 2023-11-08 NOTE — Op Note (Signed)
[facility] Patient Name: Cleo Byron Procedure Date: 11/08/2023 2:44 PM MRN: [id number] Endoscopist: Napoleon Form , MD, [id number] Age: 52 Referring MD:  Date of Birth: 1971-11-01 Gender: Male Account #: [id number] Procedure:                Colonoscopy Indications:              Screening for colorectal malignant neoplasm Medicines:                Monitored Anesthesia Care Procedure:                Pre-Anesthesia Assessment:                           - Prior to the procedure, a History and Physical                            was performed, and patient medications and                            allergies were reviewed. The patient's tolerance of                            previous anesthesia was also reviewed. The risks                            and benefits of the procedure and the sedation                            options and risks were discussed with the patient.                            All questions were answered, and informed consent                            was obtained. Prior Anticoagulants: The patient has                            taken no anticoagulant or antiplatelet agents. ASA                            Grade Assessment: II - A patient with mild systemic                            disease. After reviewing the risks and benefits,                            the patient was deemed in satisfactory condition to                            undergo the procedure.                           After obtaining informed consent, the colonoscope  was passed under direct vision. Throughout the                            procedure, the patient's blood pressure, pulse, and                            oxygen saturations were monitored continuously. The                            Olympus Scope [id number] was introduced through the                            anus and advanced to the the cecum, identified by                             appendiceal orifice and ileocecal valve. The                            colonoscopy was performed without difficulty. The                            patient tolerated the procedure well. The quality                            of the bowel preparation was adequate. The                            ileocecal valve, appendiceal orifice, and rectum                            were photographed. Scope In: 2:50:11 PM Scope Out: 3:08:52 PM Scope Withdrawal Time: 0 hours 10 minutes 14 seconds  Total Procedure Duration: 0 hours 18 minutes 41 seconds  Findings:                 The perianal and digital rectal examinations were                            normal.                           Two sessile polyps were found in the transverse                            colon and cecum. The polyps were 4 to 5 mm in size.                            These polyps were removed with a cold snare.                            Resection and retrieval were complete.                           Non-bleeding external and internal hemorrhoids were  found during retroflexion. The hemorrhoids were                            medium-sized. Complications:            No immediate complications. Estimated Blood Loss:     Estimated blood loss was minimal. Impression:               - Two 4 to 5 mm polyps in the transverse colon and                            in the ascending colon, removed with a cold snare.                            Resected and retrieved.                           - Non-bleeding external and internal hemorrhoids. Recommendation:           - Patient has a contact number available for                            emergencies. The signs and symptoms of potential                            delayed complications were discussed with the                            patient. Return to normal activities tomorrow.                            Written discharge instructions were provided to the                             patient.                           - Resume previous diet.                           - Continue present medications.                           - Await pathology results.                           - Repeat colonoscopy in 5-10 years for surveillance                            based on pathology results. Napoleon Form, MD 11/08/2023 3:15:10 PM This report has been signed electronically.

## 2023-11-08 NOTE — Progress Notes (Unsigned)
[facility] History and Physical   Primary Care Physician:  Renford Dills, MD   Reason for Procedure:  Colorectal cancer screening  Plan:    Screening colonoscopy with possible interventions as needed     HPI: Hayden Lopez is a very pleasant 52 y.o. male here for screening colonoscopy. Denies any nausea, vomiting, abdominal pain, melena or bright red blood per rectum  The risks and benefits as well as alternatives of endoscopic procedure(s) have been discussed and reviewed. All questions answered. The patient agrees to proceed.    No past medical history on file.  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 Authorizing Provider  Amphetamine Sulfate (EVEKEO) 10 MG TABS Take 2.5 mg by mouth daily.   Yes [provider]  buPROPion (WELLBUTRIN XL) 150 MG 24 hr tablet Take 150 mg by mouth daily.   Yes [provider]    Current Outpatient Medications  Medication Sig Dispense Refill   Amphetamine Sulfate (EVEKEO) 10 MG TABS Take 2.5 mg by mouth daily.     buPROPion (WELLBUTRIN XL) 150 MG 24 hr tablet Take 150 mg by mouth daily.     Current Facility-Administered Medications  Medication Dose Route Frequency Provider Last Rate Last Admin   0.9 %  sodium chloride infusion  500 mL Intravenous Continuous Anhthu Perdew, Eleonore Chiquito, MD        Allergies as of 11/08/2023 - Review Complete 11/08/2023  Allergen Reaction Noted   Latex Dermatitis 10/18/2023    Family History  Problem Relation Age of Onset   Heart disease Father    Breast cancer Maternal Grandmother    Colon cancer Neg Hx    Esophageal cancer Neg Hx    Rectal cancer Neg Hx    Stomach cancer Neg Hx     Social History   Socioeconomic History   Marital status: Married     Spouse name: Not on file   Number of children: Not on file   Years of education: Not on file   Highest education level: Not on file  Occupational History   Not on file  Tobacco Use   Smoking status: Never   Smokeless tobacco: Not on file  Vaping Use   Vaping status: Never Used  Substance and Sexual Activity   Alcohol use: Yes    Alcohol/week: 3.0 standard drinks of alcohol    Types: 3 Shots of liquor per week   Drug use: Yes    Types: Other-see comments    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  Physical Exam: Vital signs in last 24 hours: BP 124/88   Pulse 88   Temp 97.8 F (36.6 C)   Ht 5\' 9"  (1.753 m)   Wt 198 lb (89.8  kg)   SpO2 97%   BMI 29.24 kg/m  General:   Alert, NAD Lungs:  Clear .   Heart:  Regular rate and rhythm Abdomen:  Soft, nontender and nondistended. Neuro/Psych:  Alert and cooperative. Normal mood and affect. A and O x 3  Reviewed labs, radiology imaging, old records and pertinent past GI work up  Patient is appropriate for planned procedure(s) and anesthesia in an ambulatory setting   K. Scherry Ran , MD [phone]

## 2023-11-08 NOTE — Progress Notes (Signed)
Called to room to assist during endoscopic procedure.  Patient ID and intended procedure confirmed with present staff. Received instructions for my participation in the procedure from the performing physician.  

## 2023-11-08 NOTE — Patient Instructions (Signed)
Handouts provided on polyps and diverticulosis.  Resume previous diet.  Continue present medications.  Await pathology results.  Repeat colonoscopy in 5-10 years for surveillance based on pathology results.   YOU HAD AN ENDOSCOPIC PROCEDURE TODAY AT THE [facility]:   Refer to the procedure report that was given to you for any specific questions about what was found during the examination.  If the procedure report does not answer your questions, please call your gastroenterologist to clarify.  If you requested that your care partner not be given the details of your procedure findings, then the procedure report has been included in a sealed envelope for you to review at your convenience later.  YOU SHOULD EXPECT: Some feelings of bloating in the abdomen. Passage of more gas than usual.  Walking can help get rid of the air that was put into your GI tract during the procedure and reduce the bloating. If you had a lower endoscopy (such as a colonoscopy or flexible sigmoidoscopy) you may notice spotting of blood in your stool or on the toilet paper. If you underwent a bowel prep for your procedure, you may not have a normal bowel movement for a few days.  Please Note:  You might notice some irritation and congestion in your nose or some drainage.  This is from the oxygen used during your procedure.  There is no need for concern and it should clear up in a day or so.  SYMPTOMS TO REPORT IMMEDIATELY:  Following lower endoscopy (colonoscopy or flexible sigmoidoscopy):  Excessive amounts of blood in the stool  Significant tenderness or worsening of abdominal pains  Swelling of the abdomen that is new, acute  Fever of 100F or higher  For urgent or emergent issues, a gastroenterologist can be reached at any hour by calling [phone]. Do not use MyChart messaging for urgent concerns.    DIET:  We do recommend a small meal at first, but then you may proceed to your regular diet.  Drink  plenty of fluids but you should avoid alcoholic beverages for 24 hours.  ACTIVITY:  You should plan to take it easy for the rest of today and you should NOT DRIVE or use heavy machinery until tomorrow (because of the sedation medicines used during the test).    FOLLOW UP: Our staff will call the number listed on your records the next business day following your procedure.  We will call around 7:15- 8:00 am to check on you and address any questions or concerns that you may have regarding the information given to you following your procedure. If we do not reach you, we will leave a message.     If any biopsies were taken you will be contacted by phone or by letter within the next 1-3 weeks.  Please call us at [phone] if you have not heard about the biopsies in 3 weeks.    SIGNATURES/CONFIDENTIALITY: You and/or your care partner have signed paperwork which will be entered into your electronic medical record.  These signatures attest to the fact that that the information above on your After Visit Summary has been reviewed and is understood.  Full responsibility of the confidentiality of this discharge information lies with you and/or your care-partner.

## 2023-11-09 ENCOUNTER — Telehealth: Payer: Self-pay | Admitting: *Deleted

## 2023-11-09 NOTE — Telephone Encounter (Signed)
  Follow up Call-     11/08/2023    2:19 PM  Call back number  Post procedure Call Back phone  # [phone]  Permission to leave phone message Yes     Patient questions:  Do you have a fever, pain , or abdominal swelling? No. Pain Score  0 *  Have you tolerated food without any problems? Yes.    Have you been able to return to your normal activities? Yes.    Do you have any questions about your discharge instructions: Diet   No. Medications  No. Follow up visit  No.  Do you have questions or concerns about your Care? No.  Actions: * If pain score is 4 or above: No action needed, pain <4.

## 2023-11-11 LAB — SURGICAL PATHOLOGY

## 2023-12-13 ENCOUNTER — Encounter: Payer: Self-pay | Admitting: Gastroenterology
